# Patient Record
Sex: Female | Born: 1970 | Race: Black or African American | Hispanic: No | Marital: Single | Smoking: Former smoker
Health system: Southern US, Community
[De-identification: ages and names within clinical notes are randomized; demographics above are authoritative.]

## PROBLEM LIST (undated history)

## (undated) DIAGNOSIS — F419 Anxiety disorder, unspecified: Secondary | ICD-10-CM

## (undated) DIAGNOSIS — F410 Panic disorder [episodic paroxysmal anxiety] without agoraphobia: Secondary | ICD-10-CM

## (undated) DIAGNOSIS — R55 Syncope and collapse: Secondary | ICD-10-CM

## (undated) HISTORY — PX: OTHER SURGICAL HISTORY: SHX169

## (undated) HISTORY — DX: Anxiety disorder, unspecified: F41.9

## (undated) HISTORY — DX: Syncope and collapse: R55

## (undated) HISTORY — DX: Panic disorder (episodic paroxysmal anxiety): F41.0

---

## 1999-07-09 ENCOUNTER — Other Ambulatory Visit: Admission: RE | Admit: 1999-07-09 | Discharge: 1999-07-09 | Payer: Self-pay | Admitting: Obstetrics & Gynecology

## 2000-01-30 ENCOUNTER — Inpatient Hospital Stay (HOSPITAL_COMMUNITY): Admission: AD | Admit: 2000-01-30 | Discharge: 2000-02-01 | Payer: Self-pay | Admitting: Obstetrics and Gynecology

## 2000-11-28 ENCOUNTER — Other Ambulatory Visit: Admission: RE | Admit: 2000-11-28 | Discharge: 2000-11-28 | Payer: Self-pay | Admitting: Obstetrics and Gynecology

## 2002-04-05 ENCOUNTER — Other Ambulatory Visit: Admission: RE | Admit: 2002-04-05 | Discharge: 2002-04-05 | Payer: Self-pay | Admitting: Obstetrics and Gynecology

## 2003-05-13 ENCOUNTER — Other Ambulatory Visit: Admission: RE | Admit: 2003-05-13 | Discharge: 2003-05-13 | Payer: Self-pay | Admitting: Obstetrics and Gynecology

## 2003-06-01 DIAGNOSIS — R55 Syncope and collapse: Secondary | ICD-10-CM

## 2003-06-01 HISTORY — DX: Syncope and collapse: R55

## 2004-02-20 ENCOUNTER — Ambulatory Visit (HOSPITAL_COMMUNITY): Admission: RE | Admit: 2004-02-20 | Discharge: 2004-02-20 | Payer: Self-pay | Admitting: Internal Medicine

## 2004-03-13 ENCOUNTER — Ambulatory Visit (HOSPITAL_COMMUNITY): Admission: RE | Admit: 2004-03-13 | Discharge: 2004-03-13 | Payer: Self-pay | Admitting: Internal Medicine

## 2004-05-08 ENCOUNTER — Ambulatory Visit: Payer: Self-pay | Admitting: Internal Medicine

## 2004-08-17 ENCOUNTER — Ambulatory Visit: Payer: Self-pay | Admitting: Internal Medicine

## 2004-08-20 ENCOUNTER — Other Ambulatory Visit: Admission: RE | Admit: 2004-08-20 | Discharge: 2004-08-20 | Payer: Self-pay | Admitting: Obstetrics and Gynecology

## 2005-07-09 ENCOUNTER — Ambulatory Visit: Payer: Self-pay | Admitting: Internal Medicine

## 2005-07-26 ENCOUNTER — Ambulatory Visit: Payer: Self-pay | Admitting: Internal Medicine

## 2005-07-26 ENCOUNTER — Ambulatory Visit (HOSPITAL_COMMUNITY): Admission: RE | Admit: 2005-07-26 | Discharge: 2005-07-26 | Payer: Self-pay | Admitting: Internal Medicine

## 2005-08-04 ENCOUNTER — Ambulatory Visit: Payer: Self-pay

## 2010-03-23 ENCOUNTER — Emergency Department (HOSPITAL_COMMUNITY): Admission: EM | Admit: 2010-03-23 | Discharge: 2010-03-23 | Payer: Self-pay | Admitting: Emergency Medicine

## 2010-03-27 ENCOUNTER — Emergency Department (HOSPITAL_COMMUNITY): Admission: EM | Admit: 2010-03-27 | Discharge: 2010-03-28 | Payer: Self-pay | Admitting: Emergency Medicine

## 2010-06-21 ENCOUNTER — Encounter: Payer: Self-pay | Admitting: Cardiovascular Disease

## 2010-10-16 NOTE — Op Note (Signed)
NAMEADLEE, PAAR NO.:  [id number]   MEDICAL RECORD NO.:  [id number]          PATIENT TYPE:  OIB   LOCATION:  2899                         FACILITY:  [facility]   PHYSICIAN:  Duke Salvia, M.D.  DATE OF BIRTH:  1971/01/16   DATE OF PROCEDURE:  DATE OF DISCHARGE:  03/13/2004                                 OPERATIVE REPORT   DATE OF PROCEDURE:  March 13, 2004.   PREOPERATIVE DIAGNOSIS:  Recurrent syncope.   POSTOPERATIVE DIAGNOSIS:  Recurrent syncope.   PROCEDURE:  Implantation of an inframammary loop recorder.   SURGEON:  Duke Salvia, MD.   DESCRIPTION OF PROCEDURE:  Following obtaining informed consent, the patient  was brought to the electrophysiology laboratory and placed on the  fluoroscopic table in the supine position.  After routine prep and drape of  the inframammary region, an incision was made in the inframammary crease and  carried down about a half a centimeter into the breast tissue bordering on  the fascia.  A pocket was then created extending about a half a centimeter  to 1 cm below the incision and extended 3-4 cm cephalad to the incision.  Hemostasis was obtained.  Two sutures were then placed at the inferior  aspect of the pocket and were used to secure a Medtronic Reveal Plus 9526  loop recorder, serial #[id number].  The pocket was copiously irrigated with  antibiotic containing saline solution, hemostasis was assured, and the wound  was then closed in 3 layers in the normal fashion.  The wound was  washed, dried, and a Benzoin/Steri-Strip dressing was applied.  Needle  counts, sponge counts, and instrument counts were correct at the end of the  procedure, according to the staff.   The patient tolerated the procedure without apparent complication.       SCK/MEDQ  D:  03/13/2004  T:  03/13/2004  Job:  [id number]   cc:   Electrophysiology Laboratory   [facility]   Tammy R. Collins Scotland, M.D.  [address]  [place]  [place] [address]  Fax: [phone]

## 2010-10-16 NOTE — Op Note (Signed)
NAMEAKIRRA, LACERDA NO.:  [id number]   MEDICAL RECORD NO.:  [id number]          PATIENT TYPE:  OIB   LOCATION:  2899                         FACILITY:  [facility]   PHYSICIAN:  Duke Salvia, M.D.  DATE OF BIRTH:  01/28/1971   DATE OF PROCEDURE:  07/26/2005  DATE OF DISCHARGE:                                 OPERATIVE REPORT   PREOPERATIVE DIAGNOSIS:  Syncope with previously implanted inframammary loop  recorder.   POSTOPERATIVE DIAGNOSIS:  Syncope with previously implanted inframammary  loop recorder.   OPERATION PERFORMED:  Explantation of the loop recorder.   DESCRIPTION OF PROCEDURE:  Following the obtaining of informed consent, the  patient was brought to the electrophysiology laboratory and placed on the  fluoroscopic table in supine position.  After routine prep and drape,  lidocaine was infiltrated along the line of the previous incision.  The  incision was made and carried to the layer of the device.  It was noted that  the device had migrated about a half an inch caudally.  This made it very  difficult to expose the anchoring sutures as well as to explant the device.  However, we ended up opening up a small extension of the pocket cephalad of  the loupe recorder. That allowed [place] to push the loupe recorder cephalad and  deliver it in a caudal direction.  There was then some bleeding in the  cephalad area which we were able to cauterize.  The pocket was copiously  irrigated with antibiotic containing saline solution.  Hemostasis was  assured in the wound.  The anterior and posterior aspects of the pocket were  closed cephalad and caudal to the incision.  The wound was then closed in  three layers in normal fashion.  The wound was washed, dried and benzoin and  Steri-Strip dressing was applied.  Sponge, needle and instrument counts were  correct at end of the procedure according to staff.  The patient tolerated  the procedure without apparent  complication.           ______________________________  Duke Salvia, M.D.     SCK/MEDQ  D:  07/26/2005  T:  07/26/2005  Job:  [id number]   cc:   Tammy R. Collins Scotland, M.D.  Fax: [phone]   [facility]   electrophys lab

## 2010-10-16 NOTE — Discharge Summary (Signed)
[facility]  Patient:    LILYAUNA, MIEDEMA                     MRN: [id number] Adm. Date:  [id number] Disc. Date: [id number] Attending:  Conley Simmonds A Dictator:   Leilani Able, P.A.                           Discharge Summary  FINAL DIAGNOSIS:              Vacuum assisted vaginal delivery of a female infant with Apgars of 9 and 9.  HOSPITAL COURSE:              This 40 year old G3, P1-0-1-1 presents at [redacted] weeks gestation in active labor.  Patients prenatal course has been uncomplicated.  AROM was performed later on January 30, 2000.  The patient received a heavy dosing of an epidural prior to her cervix being completely dilated.  She began pushing with only a fair maternal effort and at this point some moderate to severe variable decelerations began and the vertex was at about +3 station.  At this point, the Mityvac was placed by Marcelle Overlie, M.D.  The patient delivered a 6 pound 3 ounce female infant with Apgars of 9 and 9. The delivery occurred on the third vacuum effort.  There was a midline episiotomy performed with third degree extension.  There was a nuchal cord x 1 noted.  Delivery went without complications.  The patients laceration was repaired.  The patient and baby tolerated the procedure well.  The patients postpartum course was benign without significant fevers. She was thought ready for discharge on postpartum day #2.  Her infant was circumcised before discharge.  DISCHARGE DIET:               She was sent home on a regular diet.  ACTIVITY:                     She was told to decrease activities.  DISCHARGE MEDICATIONS:        She was told to continue prenatal vitamins and FESO4 325 mg one b.i.d.  She was given Percocet 5 mg one to two every four hours as needed for pain.  FOLLOW-UP:                    She was told to follow up in the office in four to six weeks. DD:  02/24/00 TD:  02/24/00 Job: [id number]

## 2010-10-16 NOTE — Op Note (Signed)
[name], Breanna Norman NO.:  [id number]   MEDICAL RECORD NO.:  [id number]          PATIENT TYPE:  OIB   LOCATION:  2899                         FACILITY:  [facility]   PHYSICIAN:  Duke Salvia, M.D.  DATE OF BIRTH:  1970/12/10   DATE OF PROCEDURE:  02/20/2004  DATE OF DISCHARGE:                                 OPERATIVE REPORT   PREOPERATIVE DIAGNOSIS:  Recurrent syncope.   POSTOPERATIVE DIAGNOSIS:  Recurrent syncope.   PROCEDURE PERFORMED:  Head up tilt table testing followed by epinephrine  infusion.   SURGEON:  Duke Salvia, M.D.   DESCRIPTION OF PROCEDURE:  After equilibration in the supine position, the  patient was tilted upright for 70 degrees for 30 minutes.  There was no  significant change in blood pressure.  There was a modest increase in her  heart rate from the high 50s and low 60s to a mean of about 80.   The patient was returned to the supine position.  The patient was then  started on epinephrine infusion protocol per Ebony Hail for long QT  syndrome.  She received 0.5 mcg/kilo per minute for a total of approximately  two minutes.  The test was terminated because of chest pain and the  development of polymorphic ventricular tachycardia.   IMPRESSION:  1.  Negative tilt table test.  2.  Nondiagnostic epinephrine infusion prolonged QT syndrome.   The patient will be submitted now for flecainide challenge and a  consideration of loop recorder implantation.       SCK/MEDQ  D:  02/20/2004  T:  02/21/2004  Job:  [id number]   cc:   Tammy R. Collins Scotland, M.D.  [address]  [place]  [place] [address]  Fax: [phone]   Electrophysiology laboratory   [facility]

## 2010-10-16 NOTE — Op Note (Signed)
[facility]  Patient:    Breanna Norman, Breanna Norman                     MRN: [id number] Proc. Date: 01/30/00 Adm. Date:  [id number] Disc. Date: [id number] Attending:  Conley Simmonds A                           Operative Report  PREOPERATIVE DIAGNOSES:       1. Intrauterine gestation at 39 weeks.                               2. Maternal exhaustion.                               3. Moderate-to-severe variable fetal heart rate                                  decelerations.  POSTOPERATIVE DIAGNOSES:      1. Intrauterine gestation at 39 weeks.                               2. Right occipitoposterior position.  PROCEDURE PERFORMED:          Vacuum-assisted vaginal delivery.  SURGEON:                      Brook A. Edward Jolly, M.D.  ANESTHESIA:                   Epidural.  INTRAVENOUS FLUIDS:           Ringers lactate 1700 cc.  ESTIMATED BLOOD LOSS:         Less than 500 cc.  URINE OUTPUT:                 400 cc prior to procedure.  COMPLICATIONS:                None.  INDICATIONS FOR PROCEDURE:    The patient was a 40 year old gravida 3, para 1-0-1-1 at [redacted] weeks gestation, who presented in early labor on January 30, 2000 with a cervix 3-cm dilated, 80% effaced and with a vertex at a -1 station.  The fetal heart rate tracing was reactive.  The patient was noted to have irregular contractions.  The patients labor progressed and she was admitted and received an epidural for anesthesia.  She had artificial rupture of membranes when she achieved 5 cm of cervical dilatation and the fluid was noted to be clear.  The patient was noted to develop an occasional mild-to-moderate variable fetal heart rate deceleration and the fetal heart rate tracing was otherwise reassuring.  The patient received an additional bolus dosing of the epidural anesthesia, and her cervix was checked after this; she was found to be completely dilated, with the vertex at the +2 station.  The  patient began pushing and had only a fair maternal effort, when moderate-to-severe fetal heart rate decelerations began with the vertex at the +3 station.  At this time, the obstetrician made a recommendation to proceed with a vacuum-assisted delivery.  FINDINGS:  A viable female infant was born, with Apgars of 9 at one minute and 9 at five minutes.  A midline episiotomy was performed to assist with delivery, and this resulted in a partial third degree extension. A nuchal cord x 1 was noted.  The placenta delivered spontaneously, with a normal insertion of a three-vessel cord.  There was also a right hymenal laceration appreciated.  DESCRIPTION OF PROCEDURE:     With an IV and an epidural in place, the patient was examined and found to have the vertex at the +3 station.  The bladder was emptied of all urine using sterile technique with a red rubber catheter.  The Mityvac was placed over the fetal vertex, and appropriate pressure was applied.  With three maternal efforts, the fetus was delivered in the right occipitoposterior position over a midline episiotomy.  The nuchal cord was noted, and this was reduced easily.  The remainder of the infant was delivered and the cord was doubly clamped and cut.  The infant was noted to be vigorous at birth and the nares and mouth were suctioned immediately after delivery.  Cord blood was obtained, and the placenta delivered spontaneously.  The episiotomy was next repaired in standard fashion using a combination of 0 Vicryl and 2-0 Vicryl.  The right hymenal laceration was repaired with one through-and-through suture of 2-0 Vicryl.  There were no lacerations noted of the cervix nor the vagina.  Rectal exam at the end of the repair documented the rectal sphincter to be intact and no evidence of a fourth degree laceration.  The patient was taken out of the dorsal lithotomy position.  There were no complications to the procedure.  All  needle, instrument and sponge counts were correct.  DD:  01/30/00 TD:  02/02/00 Job: [id number]

## 2013-03-08 ENCOUNTER — Encounter: Payer: Self-pay | Admitting: Cardiology

## 2013-03-08 ENCOUNTER — Ambulatory Visit (INDEPENDENT_AMBULATORY_CARE_PROVIDER_SITE_OTHER)
Admission: RE | Admit: 2013-03-08 | Discharge: 2013-03-08 | Disposition: A | Discharge status: Home / Self Care | Payer: BC Managed Care – PPO | Source: Ambulatory Visit | Attending: Cardiology | Admitting: Cardiology

## 2013-03-08 ENCOUNTER — Ambulatory Visit (INDEPENDENT_AMBULATORY_CARE_PROVIDER_SITE_OTHER): Payer: BC Managed Care – PPO | Admitting: Cardiology

## 2013-03-08 ENCOUNTER — Telehealth: Payer: Self-pay | Admitting: General Surgery

## 2013-03-08 ENCOUNTER — Ambulatory Visit (HOSPITAL_COMMUNITY): Payer: BC Managed Care – PPO | Attending: Cardiology | Admitting: Radiology

## 2013-03-08 VITALS — BP 110/69 | HR 83 | Ht 67.0 in | Wt 144.0 lb

## 2013-03-08 DIAGNOSIS — R072 Precordial pain: Secondary | ICD-10-CM

## 2013-03-08 DIAGNOSIS — I313 Pericardial effusion (noninflammatory): Secondary | ICD-10-CM

## 2013-03-08 DIAGNOSIS — R0602 Shortness of breath: Secondary | ICD-10-CM

## 2013-03-08 DIAGNOSIS — M79609 Pain in unspecified limb: Secondary | ICD-10-CM | POA: Insufficient documentation

## 2013-03-08 DIAGNOSIS — R5383 Other fatigue: Secondary | ICD-10-CM | POA: Insufficient documentation

## 2013-03-08 DIAGNOSIS — F411 Generalized anxiety disorder: Secondary | ICD-10-CM

## 2013-03-08 DIAGNOSIS — F419 Anxiety disorder, unspecified: Secondary | ICD-10-CM

## 2013-03-08 DIAGNOSIS — R079 Chest pain, unspecified: Secondary | ICD-10-CM | POA: Insufficient documentation

## 2013-03-08 DIAGNOSIS — R5381 Other malaise: Secondary | ICD-10-CM | POA: Insufficient documentation

## 2013-03-08 DIAGNOSIS — I319 Disease of pericardium, unspecified: Secondary | ICD-10-CM | POA: Insufficient documentation

## 2013-03-08 LAB — CK TOTAL AND CKMB (NOT AT ARMC)
CK, MB: 3 ng/mL (ref 0.3–4.0)
Relative Index: 0.9 (ref 0.0–2.5)
Total CK: 325 U/L — ABNORMAL HIGH (ref 7–177)

## 2013-03-08 LAB — TROPONIN I: Troponin I: <0.3 ng/mL (ref <0.30)

## 2013-03-08 LAB — D-DIMER, QUANTITATIVE: D-Dimer, Quant: 0.49 ug/mL-FEU — ABNORMAL HIGH (ref 0.00–0.48)

## 2013-03-08 LAB — CK: Total CK: 314 U/L — ABNORMAL HIGH (ref 7–177)

## 2013-03-08 MED ORDER — IOHEXOL 350 MG/ML SOLN
80.0000 mL | Freq: Once | INTRAVENOUS | Status: AC | PRN
  Administered 2013-03-08: 80 mL via INTRAVENOUS

## 2013-03-08 NOTE — Progress Notes (Signed)
[address] [place], [place]  [address] Phone: [phone] Fax:  [phone]  Date:  03/08/2013   ID:  Breanna Norman, DOB 1971/04/04, MRN [id number]  PCP:  No primary provider on file.  Cardiologist: Armanda Magic, MD  Electrophysiologist:  Sherryl Manges, MD   History of Present Illness: Breanna Norman is a 42 y.o. female with a history of panic attacks and anxiety leading to syncope in 2005 at which time she had a loop recorder and subsequent removal after 2 years with no significant arrhythmias noted.  She presents today for evaluation of chest pain.  Prior to 3 weeks ago she was working out daily with good endurance and no chest pain.  Over the past few weeks she has had headaches, dizziness and decreased exercise tolerance.  This past Sunday she was sitting down to start eating and has severe sharp pain in the left side of her chest and she describes it as a stabbing pain.  She had multiple stabbing feelings for a few minutes and then a little later in the day.  She was seen by a nurse at her [facility] who recommended that she be seen my a Development worker, international aid.  She says that after the sharp pains went away she started having a gripping pressure in her chest like "someone is squeezing her heart" and has had problems with SOB.  She says that this gripping sensation comes and goes.  She has felt tired as well.  The chest discomfort is nonexertional.  She denies any LE edema or syncope.   Wt Readings from Last 3 Encounters:  03/08/13 144 lb (65.318 kg)     Past Medical History  Diagnosis Date  . Anxiety   . Panic attacks   . Syncope 2005    s/p loop recorder     No current outpatient prescriptions on file.   No current facility-administered medications for this visit.    Allergies:    Allergies  Allergen Reactions  . Penicillins     Social History:  The patient  reports that she has quit smoking. She does not have any smokeless tobacco history on file. She reports that  she does not drink alcohol or use illicit drugs.   Family History:  The patient's family history includes Anemia in her father; Arrhythmia in her mother; Cancer in her father; Fainting in her father; Heart attack in her father; Hypertension in her father.   ROS:  Please see the history of present illness.      All other systems reviewed and negative.   PHYSICAL EXAM: VS:  BP 110/69  Pulse 83  Ht 5\' 7"  (1.702 m)  Wt 144 lb (65.318 kg)  BMI 22.55 kg/m2 Well nourished, well developed, in no acute distress HEENT: normal Neck: no JVD Cardiac:  normal S1, S2; RRR; no murmur Lungs:  clear to auscultation bilaterally, no wheezing, rhonchi or rales Abd: soft, nontender, no hepatomegaly Ext: no edema Skin: warm and dry Neuro:  CNs 2-12 intact, no focal abnormalities noted  EKG:  NSR with no ST changes     ASSESSMENT AND PLAN:  1. Chest pain of unclear etiology.  Unlikely to be cardiac in origin.  She has a scratchy sore throat this am and some congestion so this could all be viral with possible pericarditis although EKG is normal.    - stat CPK/MB/Troponin/DDimer  - ETT to rule out ischemia  - 2D echo to assess for pericardia effusion 2. SOB 3.  Exertional fatigue  I have instructed her to call if she has worsening of her symptoms Followup with me 1 week after studies completed  Signed, Armanda Magic, MD 03/08/2013 11:41 AM

## 2013-03-08 NOTE — Telephone Encounter (Signed)
Message copied by Nita Sells on Thu Mar 08, 2013  3:56 PM ------      Message from: Armanda Magic R      Created: Thu Mar 08, 2013  3:39 PM       Please let patient know that her lab work for the heart (troponin was fine) but her d-dimer was slightly elevated.  Please order a Chest CT angio to rule out PE ------

## 2013-03-08 NOTE — Telephone Encounter (Signed)
Pt is aware and coming in today for CT in office.

## 2013-03-08 NOTE — Progress Notes (Signed)
Echocardiogram performed.  

## 2013-03-08 NOTE — Progress Notes (Signed)
Pt is aware. She came back up for a fit in for CT angio

## 2013-03-08 NOTE — Patient Instructions (Signed)
Your physician has requested that you have an exercise tolerance test. For further information please visit [email or web address] Please also follow instruction sheet, as given.  Your physician has requested that you have an echocardiogram. Echocardiography is a painless test that uses sound waves to create images of your heart. It provides your doctor with information about the size and shape of your heart and how well your heart's chambers and valves are working. This procedure takes approximately one hour. There are no restrictions for this procedure.  Please go to the lab today to have a STAT CPK,MD,Troponin, and D-dimer,  Your physician recommends that you schedule a follow-up appointment in: One week after ETT

## 2013-03-09 ENCOUNTER — Telehealth: Payer: Self-pay | Admitting: Cardiology

## 2013-03-09 NOTE — Telephone Encounter (Signed)
New Problem  Pt calling for CT results// please assist.

## 2013-03-09 NOTE — Telephone Encounter (Signed)
LMTCB

## 2013-03-12 ENCOUNTER — Telehealth: Payer: Self-pay | Admitting: Cardiology

## 2013-03-12 NOTE — Telephone Encounter (Deleted)
Error

## 2013-03-13 NOTE — Telephone Encounter (Signed)
**Note De-Identified Hooria Gasparini Obfuscation** This test was ordered by Dr Mayford Knife and pt has been advised by that office.

## 2013-03-20 ENCOUNTER — Ambulatory Visit (INDEPENDENT_AMBULATORY_CARE_PROVIDER_SITE_OTHER): Payer: BC Managed Care – PPO | Admitting: Nurse Practitioner

## 2013-03-20 ENCOUNTER — Encounter: Payer: Self-pay | Admitting: Nurse Practitioner

## 2013-03-20 VITALS — BP 127/69 | HR 111

## 2013-03-20 DIAGNOSIS — R079 Chest pain, unspecified: Secondary | ICD-10-CM

## 2013-03-20 NOTE — Progress Notes (Signed)
Exercise Treadmill Test  Pre-Exercise Testing Evaluation Rhythm: normal sinus  Rate: 71 bpm     Test  Exercise Tolerance Test Ordering MD: Armanda Magic, MD  Interpreting MD: Norma Fredrickson, NP  Unique Test No: 1  Treadmill:  1  Indication for ETT: chest pain - rule out ischemia  Contraindication to ETT: No   Stress Modality: exercise - treadmill  Cardiac Imaging Performed: non   Protocol: standard Bruce - maximal  Max BP:  171/51  Max MPHR (bpm):  179 85% MPR (bpm):  152  MPHR obtained (bpm):  173 % MPHR obtained:  96%  Reached 85% MPHR (min:sec):  8:30 Total Exercise Time (min-sec):  11 minutes  Workload in METS:  13 Borg Scale:15  Reason ETT Terminated:  patient's desire to stop    ST Segment Analysis At Rest: normal ST segments - no evidence of significant ST depression With Exercise: no evidence of significant ST depression  Other Information Arrhythmia:  No Angina during ETT:  absent (0) Quality of ETT:  diagnostic  ETT Interpretation:  normal - no evidence of ischemia by ST analysis  Comments: Patient presents today for routine GXT. Has had palpitations and atypical chest pain. Negative echo.   Today the patient exercised on the standard Bruce protocol for a total of 11 minutes.  Excellent exercise tolerance.  Adequate blood pressure response.  Clinically negative for chest pain. Test was stopped due to fatigue.  EKG negative for ischemia. No significant arrhythmia noted.   Recommendations: Reassurance.   See PCP for work up of fatigue.  Patient is agreeable to this plan and will call if any problems develop in the interim.   Rosalio Macadamia, RN, ANP-C [facility] [address] [place], [place]  [address]

## 2013-03-26 ENCOUNTER — Ambulatory Visit: Payer: Self-pay | Admitting: Cardiology

## 2013-04-02 ENCOUNTER — Encounter: Payer: Self-pay | Admitting: Cardiology

## 2013-04-02 ENCOUNTER — Ambulatory Visit (INDEPENDENT_AMBULATORY_CARE_PROVIDER_SITE_OTHER): Payer: BC Managed Care – PPO | Admitting: Cardiology

## 2013-04-02 VITALS — BP 100/66 | HR 74 | Ht 67.0 in | Wt 144.4 lb

## 2013-04-02 DIAGNOSIS — R0789 Other chest pain: Secondary | ICD-10-CM

## 2013-04-02 DIAGNOSIS — R079 Chest pain, unspecified: Secondary | ICD-10-CM

## 2013-04-02 DIAGNOSIS — R0602 Shortness of breath: Secondary | ICD-10-CM

## 2013-04-02 NOTE — Patient Instructions (Signed)
We are referring you to Dr. Laurann Montana with [facility]. They will be calling you to set up a Office visit to establish

## 2013-04-02 NOTE — Progress Notes (Signed)
  [address] [place], [place]  [address] Phone: [phone] Fax:  [phone]  Date:  04/02/2013   ID:  Breanna Norman, DOB [birth year], MRN [id number]  PCP:  No primary provider on file.  Cardiologist:  Armanda Magic, MD     History of Present Illness: Breanna Norman is a 42 y.o. female with a history of panic attacks and anxiety leading to syncope in 2005 at which time she had a loop recorder and subsequent removal after 2 years with no significant arrhythmias noted. She presents a few weeks ago for evaluation of chest pain. Prior to that she was working out daily with good endurance and no chest pain. Over the past few weeks she has had headaches, dizziness and decreased exercise tolerance. The Sunday prior to her last OV she was sitting down to start eating and has severe sharp pain in the left side of her chest and she describes it as a stabbing pain. She had multiple stabbing feelings for a few minutes and then a little later in the day. She says that after the sharp pains went away she started having a gripping pressure in her chest like "someone is squeezing her heart" and has had problems with SOB. She says that this gripping sensation comes and goes. She has felt tired as well. The chest discomfort is nonexertional. She underwent 2D echo which showed normal LVF EF 60-65%.  She also underwent ETT and exercised to 173bpm and achieved 13 mets with no ST changes.  She says that the CP is less sharp than they were before.       Wt Readings from Last 3 Encounters:  03/08/13 144 lb (65.318 kg)     Past Medical History  Diagnosis Date  . Anxiety   . Panic attacks   . Syncope 2005    s/p loop recorder     No current outpatient prescriptions on file.   No current facility-administered medications for this visit.    Allergies:    Allergies  Allergen Reactions  . Penicillins     Social History:  The patient  reports that she has quit smoking. She does not have any  smokeless tobacco history on file. She reports that she does not drink alcohol or use illicit drugs.   Family History:  The patient's family history includes Anemia in her father; Arrhythmia in her mother; Cancer in her father; Fainting in her father; Heart attack in her father; Hypertension in her father.   ROS:  Please see the history of present illness.      All other systems reviewed and negative.       ASSESSMENT AND PLAN:  1. Atypical noncardiac chest pain with no ischemia on ETT  - I reassured her that her ETT was normal at 13 mets with no ischemia 2. SOB with normal LVF  - 2D echo with normal LVF and no evidence of diastolic dysfunction I have recommended that she followup with her primary MD for further evaluation of fatigue/noncardiac CP  Signed, Armanda Magic, MD 04/02/2013 11:33 AM

## 2013-05-16 ENCOUNTER — Telehealth: Payer: Self-pay | Admitting: Cardiology

## 2013-05-16 NOTE — Telephone Encounter (Signed)
New problem    Pt need to speak to nurse concerning wt loss, hair loss and palpitations. Please call pt want to know if she need to come in.

## 2013-05-16 NOTE — Telephone Encounter (Signed)
Spoke with pt. She has been loosing weight and hair and wanted to know if we tested her thyroid when she last saw [place]. We did not. I told her to call her PCP and request a TSH and T4 be drawn to see if she has any thyroid disorders. If there are no problems with her tyroid and she continues to have any problems she is to call us back. Pt verbalized understanding.

## 2013-08-28 ENCOUNTER — Emergency Department (HOSPITAL_COMMUNITY)
Admission: EM | Admit: 2013-08-28 | Discharge: 2013-08-28 | Disposition: A | Discharge status: Home / Self Care | Payer: BC Managed Care – PPO | Attending: Emergency Medicine | Admitting: Emergency Medicine

## 2013-08-28 ENCOUNTER — Encounter (HOSPITAL_COMMUNITY): Payer: Self-pay | Admitting: Emergency Medicine

## 2013-08-28 DIAGNOSIS — Z87891 Personal history of nicotine dependence: Secondary | ICD-10-CM | POA: Insufficient documentation

## 2013-08-28 DIAGNOSIS — Z880 Allergy status to penicillin: Secondary | ICD-10-CM | POA: Insufficient documentation

## 2013-08-28 DIAGNOSIS — K044 Acute apical periodontitis of pulpal origin: Secondary | ICD-10-CM | POA: Insufficient documentation

## 2013-08-28 DIAGNOSIS — Z79899 Other long term (current) drug therapy: Secondary | ICD-10-CM | POA: Insufficient documentation

## 2013-08-28 DIAGNOSIS — R079 Chest pain, unspecified: Secondary | ICD-10-CM | POA: Insufficient documentation

## 2013-08-28 DIAGNOSIS — Z8659 Personal history of other mental and behavioral disorders: Secondary | ICD-10-CM | POA: Insufficient documentation

## 2013-08-28 DIAGNOSIS — Z792 Long term (current) use of antibiotics: Secondary | ICD-10-CM | POA: Insufficient documentation

## 2013-08-28 DIAGNOSIS — K047 Periapical abscess without sinus: Secondary | ICD-10-CM

## 2013-08-28 LAB — CBC
HCT: 42.1 % (ref 36.0–46.0)
HEMOGLOBIN: 14.3 g/dL (ref 12.0–15.0)
MCH: 31.7 pg (ref 26.0–34.0)
MCHC: 34 g/dL (ref 30.0–36.0)
MCV: 93.3 fL (ref 78.0–100.0)
Platelets: 218 10*3/uL (ref 150–400)
RBC: 4.51 MIL/uL (ref 3.87–5.11)
RDW: 12.2 % (ref 11.5–15.5)
WBC: 2.1 10*3/uL — ABNORMAL LOW (ref 4.0–10.5)

## 2013-08-28 LAB — BASIC METABOLIC PANEL
BUN: 13 mg/dL (ref 6–23)
CO2: 26 mEq/L (ref 19–32)
Calcium: 9.5 mg/dL (ref 8.4–10.5)
Chloride: 96 mEq/L (ref 96–112)
Creatinine, Ser: 1.11 mg/dL — ABNORMAL HIGH (ref 0.50–1.10)
GFR calc Af Amer: 70 mL/min — ABNORMAL LOW (ref >90)
GFR calc non Af Amer: 60 mL/min — ABNORMAL LOW (ref >90)
Glucose, Bld: 96 mg/dL (ref 70–99)
Potassium: 4.1 mEq/L (ref 3.7–5.3)
Sodium: 136 mEq/L — ABNORMAL LOW (ref 137–147)

## 2013-08-28 LAB — I-STAT TROPONIN, ED: Troponin i, poc: 0 ng/mL (ref 0.00–0.08)

## 2013-08-28 MED ORDER — HYDROCODONE-ACETAMINOPHEN 5-325 MG PO TABS: 2.0000 | ORAL_TABLET | ORAL | Status: AC | PRN

## 2013-08-28 NOTE — ED Provider Notes (Signed)
CSN: [id number]     Arrival date & time 08/28/13  1314 History   First MD Initiated Contact with Patient 08/28/13 [phone]     Chief Complaint  Patient presents with  . Dental Pain  . Chest Pain     (Consider location/radiation/quality/duration/timing/severity/associated sxs/prior Treatment) Patient is a 43 y.o. female presenting with tooth pain and chest pain. The history is provided by the patient.  Dental Pain Chest Pain  She is here for evaluation of tooth pain, and chest pain. The dental pain started 2 weeks ago. She was given an oral antibiotic, by her PCP. Yesterday, she went to an urgent care and was changed to clindamycin. Subsequent to that she developed upper abdomen and low chest discomfort that is intermittent. It goes away when she lies down, but worsens when she gets up. She denies fever, chills, nausea, vomiting, cough, back, pain or leg pain. She does not have a regular dentist, at this time. She denies weakness, or dizziness. There are no other known modifying factors.  Past Medical History  Diagnosis Date  . Anxiety   . Panic attacks   . Syncope 2005    s/p loop recorder    Past Surgical History  Procedure Laterality Date  . Loop recorder     Family History  Problem Relation Age of Onset  . Anemia Father   . Arrhythmia Mother   . Cancer Father   . Heart attack Father   . Hypertension Father   . Fainting Father    History  Substance Use Topics  . Smoking status: Former Games developermoker  . Smokeless tobacco: Not on file  . Alcohol Use: No   OB History   Grav Para Term Preterm Abortions TAB SAB Ect Mult Living                 Review of Systems  Cardiovascular: Positive for chest pain.  All other systems reviewed and are negative.      Allergies  Penicillins  Home Medications   Current Outpatient Rx  Name  Route  Sig  Dispense  Refill  . acetaminophen (TYLENOL) 500 MG tablet   Oral   Take 1,000 mg by mouth as needed for moderate pain or fever.          . Biotin 10 MG TABS   Oral   Take 10 mg by mouth daily.         . calcium-vitamin D (OSCAL WITH D) 500-200 MG-UNIT per tablet   Oral   Take 1 tablet by mouth daily.         . clindamycin (CLEOCIN) 300 MG capsule   Oral   Take 300 mg by mouth 3 (three) times daily.          Marland Kitchen. ibuprofen (ADVIL,MOTRIN) 200 MG tablet   Oral   Take 800 mg by mouth as needed for fever or moderate pain.         Marland Kitchen. OVER THE COUNTER MEDICATION   Oral   Take 5 mLs by mouth daily. Miracle 2000-multivitamin-925ml daily         . HYDROcodone-acetaminophen (NORCO) 5-325 MG per tablet   Oral   Take 2 tablets by mouth every 4 (four) hours as needed.   20 tablet   0    BP 117/70  Pulse 85  Temp(Src) 98.6 F (37 C) (Oral)  Resp 16  SpO2 97% Physical Exam  Nursing note and vitals reviewed. Constitutional: She is oriented to person, place, and time.  She appears well-developed and well-nourished.  HENT:  Head: Normocephalic and atraumatic.  She has large caries of lower molars, bilaterally. There is no associated swelling or fluctuance. There is no trismus.  Eyes: Conjunctivae and EOM are normal. Pupils are equal, round, and reactive to light.  Neck: Normal range of motion and phonation normal. Neck supple.  Cardiovascular: Normal rate, regular rhythm and intact distal pulses.   Pulmonary/Chest: Effort normal and breath sounds normal. She exhibits no tenderness.  Abdominal: Soft. She exhibits no distension. There is no tenderness. There is no guarding.  Musculoskeletal: Normal range of motion.  Neurological: She is alert and oriented to person, place, and time. She exhibits normal muscle tone.  Skin: Skin is warm and dry.  Psychiatric: She has a normal mood and affect. Her behavior is normal. Judgment and thought content normal.    ED Course  Procedures (including critical care time) Labs Review Labs Reviewed  CBC - Abnormal; Notable for the following:    WBC 2.1 (*)    All other  components within normal limits  BASIC METABOLIC PANEL - Abnormal; Notable for the following:    Sodium 136 (*)    Creatinine, Ser 1.11 (*)    GFR calc non Af Amer 60 (*)    GFR calc Af Amer 70 (*)    All other components within normal limits  I-STAT TROPOININ, ED   Imaging Review No results found.   EKG Interpretation   Date/Time:  Tuesday August 28 2013 13:24:20 EDT Ventricular Rate:  99 PR Interval:  123 QRS Duration: 72 QT Interval:  317 QTC Calculation: 407 R Axis:   71 Text Interpretation:  Sinus rhythm Biatrial enlargement Since last tracing  atrial enlargements are new Confirmed by Effie Shy  MD, Mechele Collin ([id number]) on  08/28/2013 3:10:28 PM      MDM   Final diagnoses:  Dental infection    Dental pain, possibly secondary to infection, but certainly her caries are large enough to cause pain in of themselves. No apparent evidence for deep infection in the oral oropharyngeal cavities. Her chest pain is post initiation of a new antibiotic. This can be treated symptomatically and expectantly. Per records; she recently had a cardiac catheterization, CT angiogram, and cardiology evaluation. I doubt that her chest discomfort represents ACS, or impending cardiovascular collapse.  Nursing Notes Reviewed/ Care Coordinated Applicable Imaging Reviewed Interpretation of Laboratory Data incorporated into ED treatment  The patient appears reasonably screened and/or stabilized for discharge and I doubt any other medical condition or other [facility] requiring further screening, evaluation, or treatment in the ED at this time prior to discharge.  Plan: Home Medications- Stop Meloxicam, Rx Norco, Trial of Maalox for GI/Chest discomfort; Home Treatments- rest; return here if the recommended treatment, does not improve the symptoms; Recommended follow up- PCP prn    Flint Melter, MD 08/28/13 [phone]

## 2013-08-28 NOTE — Progress Notes (Signed)
   CARE MANAGEMENT ED NOTE 08/28/2013  Patient:  Crecencio McMARTIN,Deasia A   Account Number:  [id number]  Date Initiated:  08/28/2013  Documentation initiated by:  Edd ArbourGIBBS,KIMBERLY  Subjective/Objective Assessment:   43 yr old female with [employer] [place] [employer] who confirms pcp is Dr Fulton Moleobert Reed     Subjective/Objective Assessment Detail:   had dental pain x 2 wks.  Her PCP called in abx.  Started feeling tired and began vomiting.  Had flu like sx. Switched abx.  Last night started having chest pain. Lt chest pain.  Squeezing pressure.     Action/Plan:   EPic updated   Action/Plan Detail:   Anticipated DC Date:  08/28/2013     Status Recommendation to Physician:   Result of Recommendation:    Other ED Services  Consult Working Plan    DC Planning Services  Other  Outpatient Services - Pt will follow up  PCP issues    Choice offered to / List presented to:            Status of service:  Completed, signed off  ED Comments:   ED Comments Detail:

## 2013-08-28 NOTE — Discharge Instructions (Signed)
Ask your dentist for a referral to an oral surgeon. Use Maalox for stomach pain.   Dental Pain A tooth ache may be caused by cavities (tooth decay). Cavities expose the nerve of the tooth to air and hot or cold temperatures. It may come from an infection or abscess (also called a boil or furuncle) around your tooth. It is also often caused by dental caries (tooth decay). This causes the pain you are having. DIAGNOSIS  Your caregiver can diagnose this problem by exam. TREATMENT   If caused by an infection, it may be treated with medications which kill germs (antibiotics) and pain medications as prescribed by your caregiver. Take medications as directed.  Only take over-the-counter or prescription medicines for pain, discomfort, or fever as directed by your caregiver.  Whether the tooth ache today is caused by infection or dental disease, you should see your dentist as soon as possible for further care. SEEK MEDICAL CARE IF: The exam and treatment you received today has been provided on an emergency basis only. This is not a substitute for complete medical or dental care. If your problem worsens or new problems (symptoms) appear, and you are unable to meet with your dentist, call or return to this location. SEEK IMMEDIATE MEDICAL CARE IF:   You have a fever.  You develop redness and swelling of your face, jaw, or neck.  You are unable to open your mouth.  You have severe pain uncontrolled by pain medicine. MAKE SURE YOU:   Understand these instructions.  Will watch your condition.  Will get help right away if you are not doing well or get worse. Document Released: 05/17/2005 Document Revised: 08/09/2011 Document Reviewed: 01/03/2008 [facility] Patient Information 2014 [place], [place].

## 2013-08-28 NOTE — ED Notes (Signed)
Pt states she has had dental pain x 2 wks.  Her PCP called in abx.  Started feeling tired and began vomiting.  Had flu like sx.  Switched abx.  Last night started having chest pain.  Lt chest pain.  Squeezing pressure.

## 2013-09-12 NOTE — Telephone Encounter (Signed)
error 

## 2014-02-15 ENCOUNTER — Other Ambulatory Visit: Payer: Self-pay | Admitting: Family Medicine

## 2014-02-15 ENCOUNTER — Ambulatory Visit
Admission: RE | Admit: 2014-02-15 | Discharge: 2014-02-15 | Disposition: A | Discharge status: Home / Self Care | Payer: BC Managed Care – PPO | Source: Ambulatory Visit | Attending: Family Medicine | Admitting: Family Medicine

## 2014-02-15 DIAGNOSIS — M25511 Pain in right shoulder: Secondary | ICD-10-CM

## 2014-05-07 ENCOUNTER — Emergency Department (HOSPITAL_BASED_OUTPATIENT_CLINIC_OR_DEPARTMENT_OTHER): Payer: BC Managed Care – PPO

## 2014-05-07 ENCOUNTER — Encounter (HOSPITAL_BASED_OUTPATIENT_CLINIC_OR_DEPARTMENT_OTHER): Payer: Self-pay | Admitting: Emergency Medicine

## 2014-05-07 DIAGNOSIS — Z880 Allergy status to penicillin: Secondary | ICD-10-CM | POA: Insufficient documentation

## 2014-05-07 DIAGNOSIS — Z8659 Personal history of other mental and behavioral disorders: Secondary | ICD-10-CM | POA: Diagnosis not present

## 2014-05-07 DIAGNOSIS — S62522A Displaced fracture of distal phalanx of left thumb, initial encounter for closed fracture: Secondary | ICD-10-CM | POA: Insufficient documentation

## 2014-05-07 DIAGNOSIS — Y93B9 Activity, other involving muscle strengthening exercises: Secondary | ICD-10-CM | POA: Insufficient documentation

## 2014-05-07 DIAGNOSIS — Y998 Other external cause status: Secondary | ICD-10-CM | POA: Insufficient documentation

## 2014-05-07 DIAGNOSIS — Z87891 Personal history of nicotine dependence: Secondary | ICD-10-CM | POA: Diagnosis not present

## 2014-05-07 DIAGNOSIS — Z792 Long term (current) use of antibiotics: Secondary | ICD-10-CM | POA: Diagnosis not present

## 2014-05-07 DIAGNOSIS — S6702XA Crushing injury of left thumb, initial encounter: Secondary | ICD-10-CM | POA: Diagnosis present

## 2014-05-07 DIAGNOSIS — W208XXA Other cause of strike by thrown, projected or falling object, initial encounter: Secondary | ICD-10-CM | POA: Insufficient documentation

## 2014-05-07 DIAGNOSIS — Y9239 Other specified sports and athletic area as the place of occurrence of the external cause: Secondary | ICD-10-CM | POA: Diagnosis not present

## 2014-05-07 NOTE — ED Notes (Signed)
Pt presents to ED with complaints of left hand pain after dropping a 45 lb weight bar on it at the gym this am.

## 2014-05-08 ENCOUNTER — Encounter (HOSPITAL_BASED_OUTPATIENT_CLINIC_OR_DEPARTMENT_OTHER): Payer: Self-pay | Admitting: Emergency Medicine

## 2014-05-08 ENCOUNTER — Emergency Department (HOSPITAL_BASED_OUTPATIENT_CLINIC_OR_DEPARTMENT_OTHER)
Admission: EM | Admit: 2014-05-08 | Discharge: 2014-05-08 | Disposition: A | Discharge status: Home / Self Care | Payer: BC Managed Care – PPO | Attending: Emergency Medicine | Admitting: Emergency Medicine

## 2014-05-08 DIAGNOSIS — T1490XA Injury, unspecified, initial encounter: Secondary | ICD-10-CM

## 2014-05-08 DIAGNOSIS — S62502A Fracture of unspecified phalanx of left thumb, initial encounter for closed fracture: Secondary | ICD-10-CM

## 2014-05-08 MED ORDER — IBUPROFEN 800 MG PO TABS
800.0000 mg | ORAL_TABLET | Freq: Once | ORAL | Status: AC
  Administered 2014-05-08: 800 mg via ORAL
  Filled 2014-05-08: qty 1

## 2014-05-08 MED ORDER — IBUPROFEN 800 MG PO TABS: 800.0000 mg | ORAL_TABLET | Freq: Three times a day (TID) | ORAL | Status: AC

## 2014-05-08 MED ORDER — HYDROCODONE-ACETAMINOPHEN 5-325 MG PO TABS: 1.0000 | ORAL_TABLET | Freq: Four times a day (QID) | ORAL | Status: AC | PRN

## 2014-05-08 NOTE — ED Provider Notes (Signed)
CSN: [id number]     Arrival date & time 05/07/14  2321 History   First MD Initiated Contact with Patient 05/08/14 0107     Chief Complaint  Patient presents with  . Hand Injury     (Consider location/radiation/quality/duration/timing/severity/associated sxs/prior Treatment) Patient is a 43 y.o. female presenting with hand injury. The history is provided by the patient.  Hand Injury Location:  Finger Time since incident:  42 hours Injury: yes   Mechanism of injury: crush   Crush injury:    Mechanism: weight lifting bar 45 lbs.   Approximate weight of object:  45 Finger location:  L thumb Pain details:    Quality:  Aching   Radiates to:  Does not radiate   Severity:  Severe   Onset quality:  Sudden   Timing:  Constant   Progression:  Unchanged Chronicity:  New Dislocation: no   Foreign body present:  No foreign bodies Relieved by:  Nothing Worsened by:  Nothing tried Ineffective treatments:  None tried Associated symptoms: no muscle weakness and no tingling   Risk factors: no concern for non-accidental trauma     Past Medical History  Diagnosis Date  . Anxiety   . Panic attacks   . Syncope 2005    s/p loop recorder    Past Surgical History  Procedure Laterality Date  . Loop recorder     Family History  Problem Relation Age of Onset  . Anemia Father   . Arrhythmia Mother   . Cancer Father   . Heart attack Father   . Hypertension Father   . Fainting Father    History  Substance Use Topics  . Smoking status: Former Games developermoker  . Smokeless tobacco: Not on file  . Alcohol Use: No   OB History    No data available     Review of Systems  All other systems reviewed and are negative.     Allergies  Penicillins  Home Medications   Prior to Admission medications   Medication Sig Start Date End Date Taking? Authorizing Provider  doxycycline (PERIOSTAT) 20 MG tablet Take 10 mg by mouth 2 (two) times daily.   Yes Historical Provider, MD  oxybutynin  (DITROPAN) 5 MG/5ML syrup Take by mouth daily.   Yes Historical Provider, MD  acetaminophen (TYLENOL) 500 MG tablet Take 1,000 mg by mouth as needed for moderate pain or fever.    Historical Provider, MD  Biotin 10 MG TABS Take 10 mg by mouth daily.    Historical Provider, MD  calcium-vitamin D (OSCAL WITH D) 500-200 MG-UNIT per tablet Take 1 tablet by mouth daily.    Historical Provider, MD  clindamycin (CLEOCIN) 300 MG capsule Take 300 mg by mouth 3 (three) times daily.  08/27/13   Historical Provider, MD  HYDROcodone-acetaminophen (NORCO) 5-325 MG per tablet Take 2 tablets by mouth every 4 (four) hours as needed. 08/28/13   Breanna MelterElliott L Wentz, MD  ibuprofen (ADVIL,MOTRIN) 200 MG tablet Take 800 mg by mouth as needed for fever or moderate pain.    Historical Provider, MD  OVER THE COUNTER MEDICATION Take 5 mLs by mouth daily. Miracle 2000-multivitamin-485ml daily    Historical Provider, MD   BP 126/84 mmHg  Pulse 58  Temp(Src) 98 F (36.7 C) (Oral)  Resp 20  SpO2 100%  LMP 05/04/2014 Physical Exam  Constitutional: She is oriented to person, place, and time. She appears well-developed and well-nourished. No distress.  HENT:  Head: Normocephalic and atraumatic.  Mouth/Throat: Oropharynx is  clear and moist.  Eyes: Conjunctivae are normal. Pupils are equal, round, and reactive to light.  Neck: Normal range of motion. Neck supple.  Cardiovascular: Normal rate and regular rhythm.   Pulmonary/Chest: Effort normal and breath sounds normal. She has no wheezes. She has no rales.  Abdominal: Soft. Bowel sounds are normal. There is no tenderness.  Musculoskeletal: Normal range of motion.       Left hand: She exhibits tenderness. She exhibits normal range of motion, normal two-point discrimination, normal capillary refill, no deformity and no laceration. Normal sensation noted. Normal strength noted.       Hands: Neurological: She is alert and oriented to person, place, and time.  Skin: Skin is warm and  dry.  Psychiatric: She has a normal mood and affect.    ED Course  Procedures (including critical care time) Labs Review Labs Reviewed - No data to display  Imaging Review Dg Hand Complete Left  05/07/2014   CLINICAL DATA:  Left bone pain, bruising, and swelling after injury this morning. Dropped a bar onto the hand.  EXAM: LEFT HAND - COMPLETE 3+ VIEW  COMPARISON:  None.  FINDINGS: Nondisplaced crush fractures to the distal phalangeal tuft of the left first finger. Fracture lines extend towards the shaft. No intra-articular involvement. Left hand appears otherwise intact. Soft tissues are unremarkable.  IMPRESSION: Crush fracture to the distal phalangeal tuft of the left first finger.   Electronically Signed   By: Burman NievesWilliam  Stevens M.D.   On: 05/07/2014 23:56     EKG Interpretation None      MDM   Final diagnoses:  Injury    Ecchymosis and crushed tuft.  No signs of vascular compromise.  Narcotics pain medication NSAIDs splint ice elevation and close follow up with Dr. Amanda PeaGramig.  Patient verbalizes understanding and agrees to follow up    Breanna Dutkiewicz Smitty CordsK Asanti Craigo-Rasch, MD 05/08/14 0201

## 2014-10-06 IMAGING — CT CT ANGIO CHEST
2 of 6 series · 19 of 36 positions shown · IV contrast (Omnipaque 300)
Comparison: None.

CLINICAL DATA: Chest pain and shortness of breath. Elevated
D-dimer.

EXAM:
CT ANGIOGRAPHY CHEST WITH CONTRAST
TECHNIQUE: Multidetector CT imaging of the chest was performed using the
standard protocol during bolus administration of intravenous
contrast. Multiplanar CT image reconstructions including MIPs were
obtained to evaluate the vascular anatomy.
CONTRAST:  80mL OMNIPAQUE IOHEXOL 350 MG/ML SOLN

[Series 5: thins (id) / (id) · axial · 0.63mm/px · z∈[-236,+14]mm · 18 of 278 slices shown]
[im 14/278  lung]
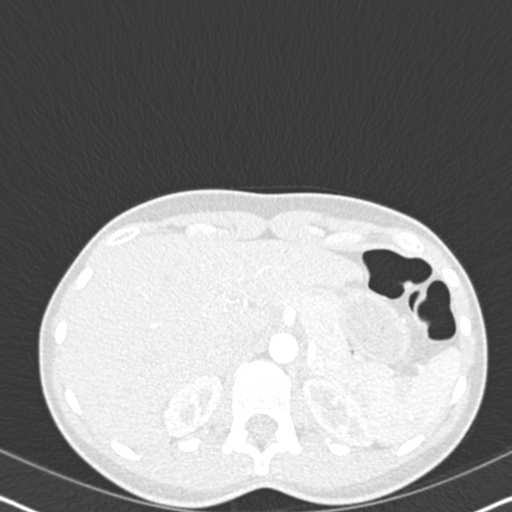
[im 28/278  mediastinal]
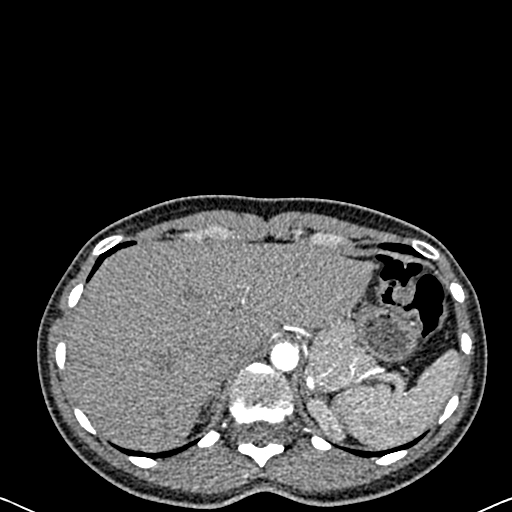
[im 42/278  lung]
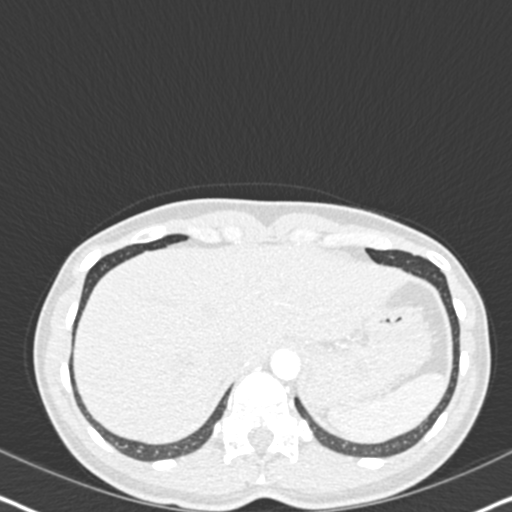
[im 56/278  mediastinal]
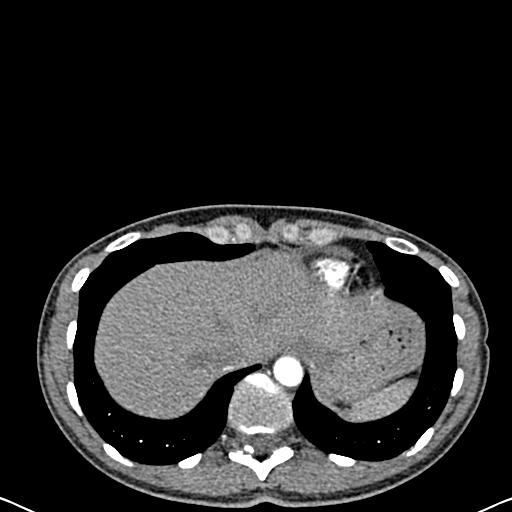
[im 70/278  lung]
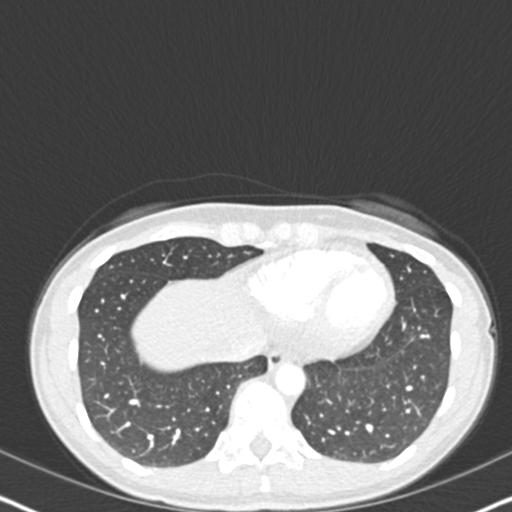
[im 84/278  mediastinal]
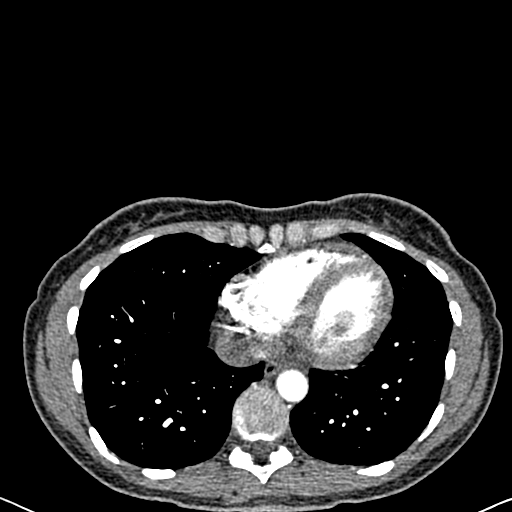
[im 97/278  lung]
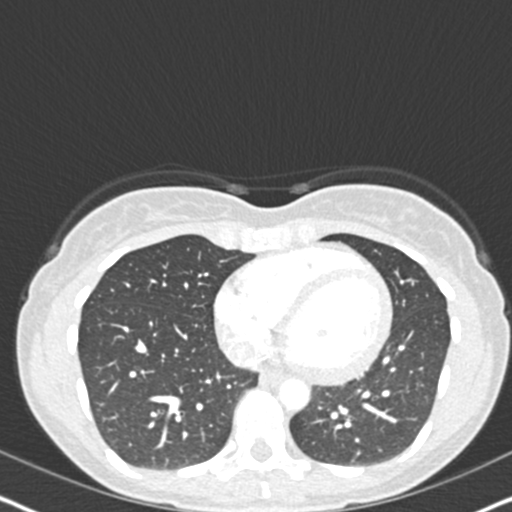
[im 111/278  mediastinal]
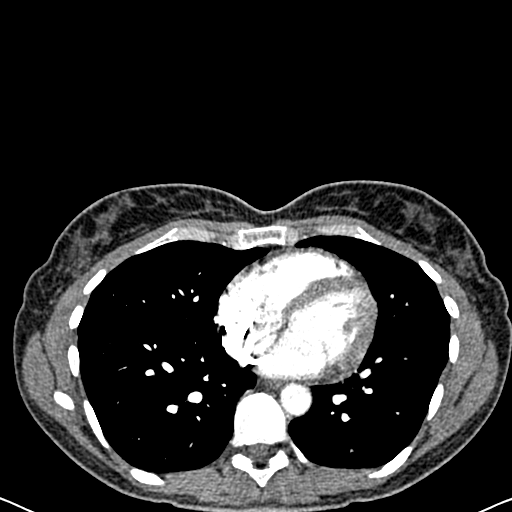
[im 125/278  lung]
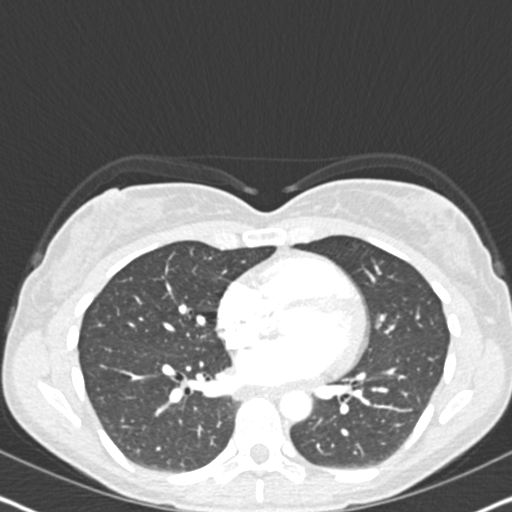
[im 153/278  mediastinal]
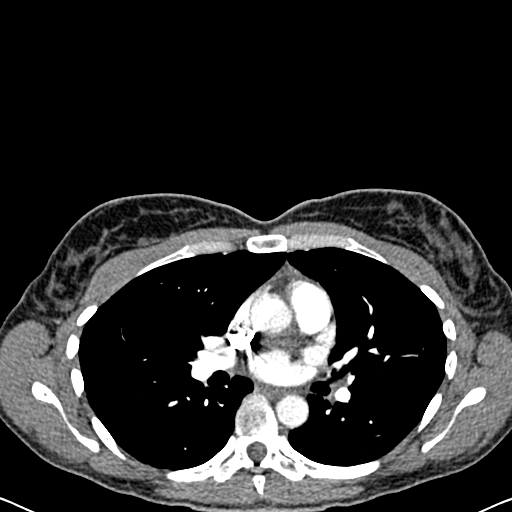
[im 167/278  lung]
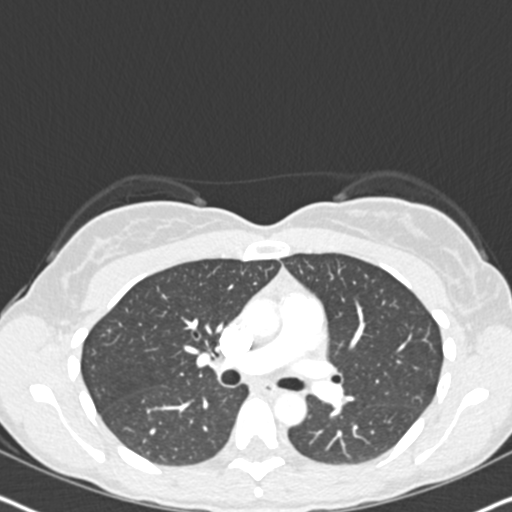
[im 181/278  mediastinal]
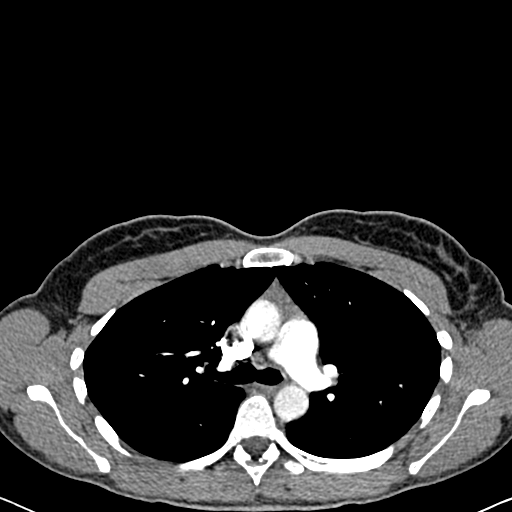
[im 194/278  lung]
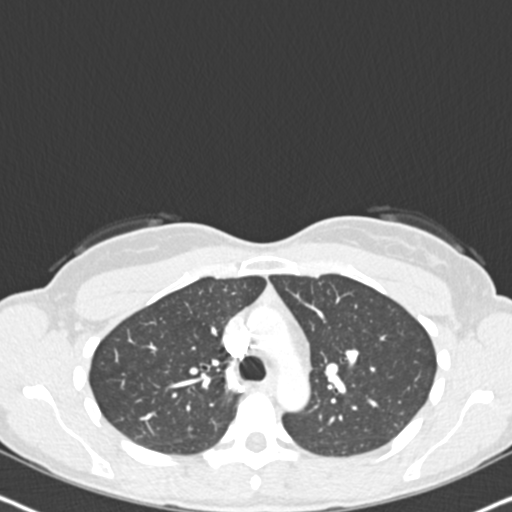
[im 208/278  mediastinal]
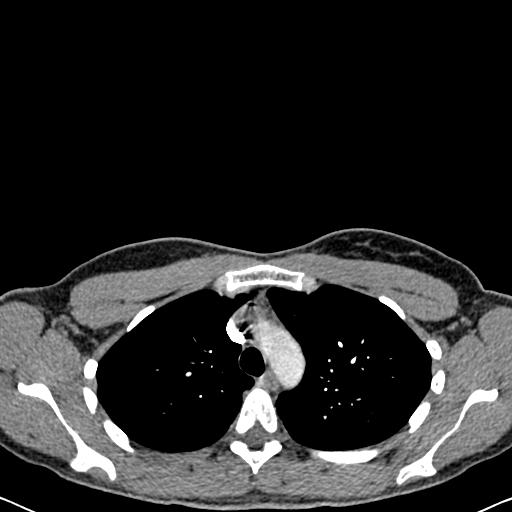
[im 222/278  lung]
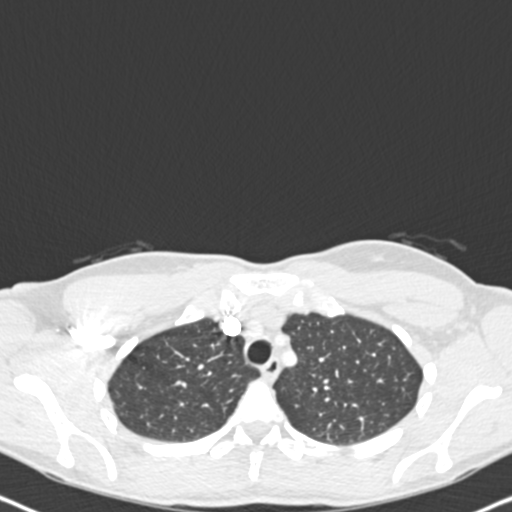
[im 236/278  mediastinal]
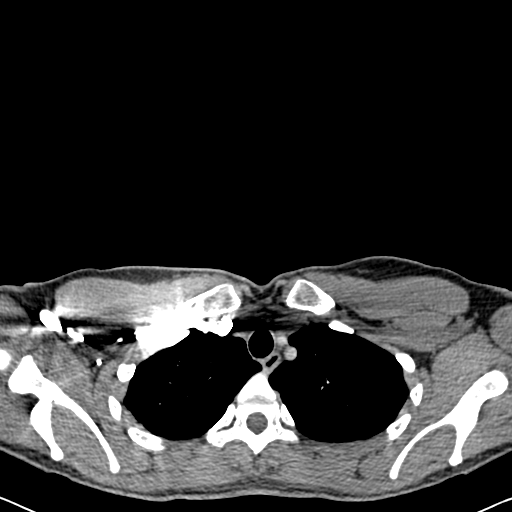
[im 250/278  lung]
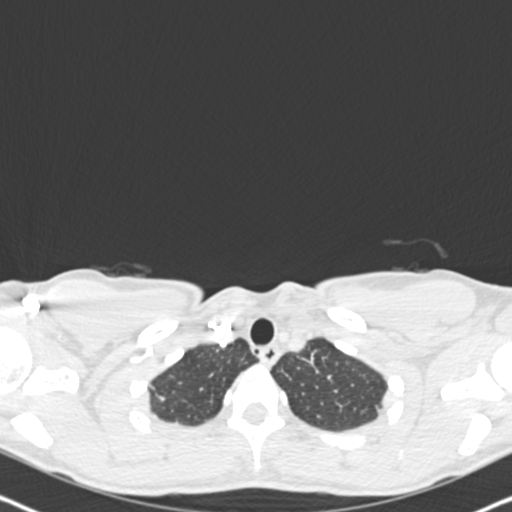
[im 264/278  mediastinal]
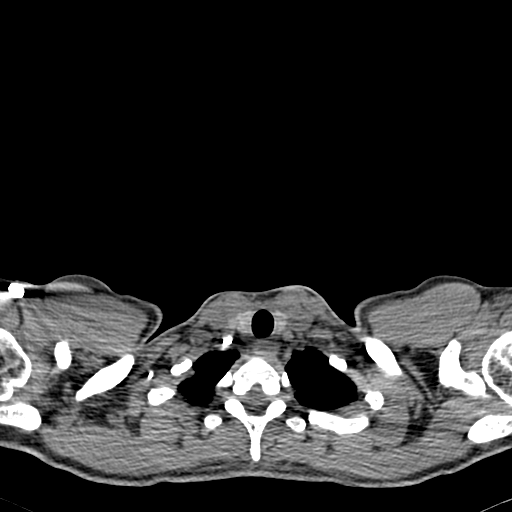

[Series 602: cor mpr · coronal · 0.63mm/px · 1 of 83 slices shown]
[im 42/83  mediastinal]
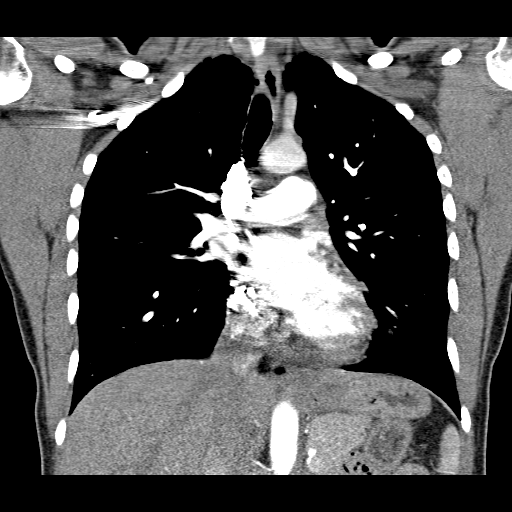

[19 of 36 positions shown; findings below may reference images not displayed]

FINDINGS: Normally opacified pulmonary arteries with mild heterogeneity. No
discrete pulmonary arterial filling are defects seen. Mild biapical
pleural and parenchymal scarring. No lung masses or enlarged lymph
nodes. Minimal thoracic spine degenerative changes. Unremarkable
upper abdomen.

Review of the MIP images confirms the above findings.
IMPRESSION: No pulmonary emboli or acute abnormality.

## 2019-01-26 ENCOUNTER — Other Ambulatory Visit: Payer: Self-pay | Admitting: Obstetrics and Gynecology

## 2019-01-26 DIAGNOSIS — N6489 Other specified disorders of breast: Secondary | ICD-10-CM

## 2019-02-06 ENCOUNTER — Ambulatory Visit
Admission: RE | Admit: 2019-02-06 | Discharge: 2019-02-06 | Disposition: A | Discharge status: Home / Self Care | Payer: 59 | Source: Ambulatory Visit | Attending: Obstetrics and Gynecology | Admitting: Obstetrics and Gynecology

## 2019-02-06 ENCOUNTER — Other Ambulatory Visit: Payer: Self-pay

## 2019-02-06 ENCOUNTER — Ambulatory Visit: Payer: Self-pay

## 2019-02-06 DIAGNOSIS — N6489 Other specified disorders of breast: Secondary | ICD-10-CM

## 2019-06-28 ENCOUNTER — Other Ambulatory Visit: Payer: Self-pay | Admitting: Nephrology

## 2019-06-28 DIAGNOSIS — N182 Chronic kidney disease, stage 2 (mild): Secondary | ICD-10-CM

## 2023-04-26 ENCOUNTER — Other Ambulatory Visit: Payer: Self-pay | Admitting: Nurse Practitioner

## 2023-04-26 DIAGNOSIS — Z1231 Encounter for screening mammogram for malignant neoplasm of breast: Secondary | ICD-10-CM

## 2023-05-16 ENCOUNTER — Ambulatory Visit
Admission: RE | Admit: 2023-05-16 | Discharge: 2023-05-16 | Disposition: A | Discharge status: Home / Self Care | Payer: 59 | Source: Ambulatory Visit | Attending: Nurse Practitioner | Admitting: Nurse Practitioner

## 2023-05-16 DIAGNOSIS — Z1231 Encounter for screening mammogram for malignant neoplasm of breast: Secondary | ICD-10-CM
